# Patient Record
Sex: Female | Born: 1970 | Hispanic: Yes | Marital: Single | Smoking: Current every day smoker
Health system: Southern US, Community
[De-identification: ages and names within clinical notes are randomized; demographics above are authoritative.]

---

## 1994-06-17 HISTORY — PX: TUBAL LIGATION: SHX77

## 2012-11-21 DIAGNOSIS — F329 Major depressive disorder, single episode, unspecified: Secondary | ICD-10-CM | POA: Insufficient documentation

## 2016-06-17 HISTORY — PX: OTHER SURGICAL HISTORY: SHX169

## 2016-12-03 ENCOUNTER — Telehealth: Payer: Self-pay | Admitting: *Deleted

## 2016-12-03 ENCOUNTER — Encounter: Payer: Self-pay | Admitting: Podiatry

## 2016-12-03 ENCOUNTER — Ambulatory Visit (INDEPENDENT_AMBULATORY_CARE_PROVIDER_SITE_OTHER): Payer: BLUE CROSS/BLUE SHIELD | Admitting: Podiatry

## 2016-12-03 VITALS — BP 112/65 | HR 64

## 2016-12-03 DIAGNOSIS — L97522 Non-pressure chronic ulcer of other part of left foot with fat layer exposed: Secondary | ICD-10-CM | POA: Diagnosis not present

## 2016-12-03 DIAGNOSIS — M21962 Unspecified acquired deformity of left lower leg: Secondary | ICD-10-CM | POA: Diagnosis not present

## 2016-12-03 DIAGNOSIS — M79672 Pain in left foot: Secondary | ICD-10-CM | POA: Diagnosis not present

## 2016-12-03 NOTE — Patient Instructions (Signed)
Seen for infected ulcer left foot. Wound soaked and debrided. Amerigel ointment dressing applied. Daily soaking and dressing change instructed.

## 2016-12-03 NOTE — Progress Notes (Signed)
SUBJECTIVE: 46 y.o. year old female presents complaining of pain in left foot. Stated that she was in hospital with infected left foot on 11/30/16.  She started to have blister with pain under the left foot a month ago. She treated with ointment. It got better and got worse. She noted of a hole formed on the area, ball of lefollen and painful on June 13th and 14th, so she checked in [facility] ER on 11/30/16. They placed her on Antibiotics and gave her pain pills. 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 The foot started with blister the got infected like this time. On feet 10 hours for 4 days at work.  On Clindamycin 300 mg q 6 hr and Hydrocodone 5/325 as needed.  REVIEW OF SYSTEMS: Pertinent items noted in HPI and remainder of comprehensive ROS otherwise negative.  OBJECTIVE: DERMATOLOGIC EXAMINATION: Plantar lesion with 2 cm wide diameter opening and callused border under 4th MPJ area left foot. Opening is deep down to subfascial area. No active drainage noted.  VASCULAR EXAMINATION OF LOWER LIMBS: All pedal pulses are palpable with normal pulsation.  Capillary Filling times within 3 seconds in all digits.  No acute edema or erythema noted. Temperature gradient from tibial crest to dorsum of foot is within normal bilateral.  NEUROLOGIC EXAMINATION OF THE LOWER LIMBS: Achilles DTR is present and within normal. Monofilament (Semmes-Weinstein 10-gm) sensory testing positive 6 out of 6, bilateral. Vibratory sensations(128Hz  turning fork) intact at medial and lateral forefoot bilateral.  Sharp and Dull discriminatory sensations at the plantar ball of hallux is intact bilateral.   MUSCULOSKELETAL EXAMINATION: Positive for High arched cavus foot with loss of 5th ray left.  RADIOGRAPHIC FINDINGS: AP View:  Post surgical 5th ray amputation at distal 1/3 of metatarsal shaft left foot. 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 Adducted  metatarsal bone with contracted lesser digits.  Osteopenia and increased bone resorption at lesser metatarsal head and proximal phalangeal base. Lateral view:  Severe high arched cavus foot. Subluxed MPJ with flexion deformity on IPJ phalanges.  Normal rearfoot joint spaces.   ASSESSMENT: Ulcer, infected under 4th MPJ left. 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  PLAN: Reviewed clinical findings and available treatment options. Left foot soaked for 15 minutes. Wound debrided. Amerigel ointment dressing applied. Daily foot soak instructed. Iodine soaking solution dispensed. Continue with antibiotics. Stay off of work till further notice. Return in one week.

## 2016-12-03 NOTE — Telephone Encounter (Signed)
12/03/16 Patient called this afternoon and states [facility] called them and insurance doesn't cover the meds prescribed.

## 2016-12-10 ENCOUNTER — Ambulatory Visit: Payer: BLUE CROSS/BLUE SHIELD | Admitting: Podiatry

## 2017-07-02 DIAGNOSIS — L03116 Cellulitis of left lower limb: Secondary | ICD-10-CM | POA: Insufficient documentation

## 2017-07-03 DIAGNOSIS — M86272 Subacute osteomyelitis, left ankle and foot: Secondary | ICD-10-CM | POA: Insufficient documentation

## 2017-07-03 DIAGNOSIS — Z792 Long term (current) use of antibiotics: Secondary | ICD-10-CM | POA: Insufficient documentation

## 2017-07-03 DIAGNOSIS — Z720 Tobacco use: Secondary | ICD-10-CM | POA: Insufficient documentation

## 2019-03-23 ENCOUNTER — Encounter: Payer: Self-pay | Admitting: Sports Medicine

## 2019-03-23 ENCOUNTER — Other Ambulatory Visit: Payer: Self-pay | Admitting: Sports Medicine

## 2019-03-23 ENCOUNTER — Ambulatory Visit (INDEPENDENT_AMBULATORY_CARE_PROVIDER_SITE_OTHER): Payer: 59 | Admitting: Sports Medicine

## 2019-03-23 ENCOUNTER — Other Ambulatory Visit: Payer: Self-pay

## 2019-03-23 ENCOUNTER — Encounter: Payer: Self-pay | Admitting: *Deleted

## 2019-03-23 ENCOUNTER — Ambulatory Visit: Payer: 59

## 2019-03-23 DIAGNOSIS — L03116 Cellulitis of left lower limb: Secondary | ICD-10-CM | POA: Diagnosis not present

## 2019-03-23 DIAGNOSIS — M86272 Subacute osteomyelitis, left ankle and foot: Secondary | ICD-10-CM

## 2019-03-23 DIAGNOSIS — L97522 Non-pressure chronic ulcer of other part of left foot with fat layer exposed: Secondary | ICD-10-CM | POA: Diagnosis not present

## 2019-03-23 DIAGNOSIS — M79671 Pain in right foot: Secondary | ICD-10-CM

## 2019-03-23 DIAGNOSIS — S52531A Colles' fracture of right radius, initial encounter for closed fracture: Secondary | ICD-10-CM | POA: Insufficient documentation

## 2019-03-23 MED ORDER — MUPIROCIN 2 % EX OINT: TOPICAL_OINTMENT | CUTANEOUS | 0 refills | Status: DC

## 2019-03-23 MED ORDER — SULFAMETHOXAZOLE-TRIMETHOPRIM 800-160 MG PO TABS: 1.0000 | ORAL_TABLET | Freq: Two times a day (BID) | ORAL | 0 refills | Status: DC

## 2019-03-23 NOTE — Progress Notes (Signed)
Subjective: Maliha Outten is a 48 y.o. female patient seen in office for evaluation of ulceration of the Left plantar forefoot. Patient has a history of foot ulcer and infection last year, reports that swelling and redness got worse on last Thursday but over the last month this problem has come back with callus to the area. Denies nausea/fever/vomiting/chills/night sweats/shortness of breath/pain. Patient has no other pedal complaints at this time.  Denies history of Diabetes but reports that she can not feel her foot. Admits to a history of infection last year and was put on IV antibiotics.   Patient Active Problem List   Diagnosis Date Noted  . Fracture, Col[place], right, closed 03/23/2019  . Subacute osteomyelitis of left foot ([place]) 07/03/2017  . Long term (current) use of antibiotics 07/03/2017  . Tobacco use 07/03/2017  . Cellulitis of left foot 07/02/2017  . Depressive disorder 11/21/2012   No current outpatient medications on file prior to visit.   No current facility-administered medications on file prior to visit.    No Known Allergies  No results found for this or any previous visit (from the past 2160 hour(s)).  Objective: There were no vitals filed for this visit.  General: Patient is awake, alert, oriented x 3 and in no acute distress.  Dermatology: Skin is warm and dry bilateral with a full thickness ulceration present  Sub met 4 on left. Ulceration measures 0.5 cm x 0.5cm x 0.5 cm. There is a  keratotic border with a macerated base. The ulceration does probe to capsule. There is no malodor, no active drainage, + erythema, + edema. No other acute signs of infection.   Vascular: Dorsalis Pedis pulse = 1/4 Bilateral,  Posterior Tibial pulse = 1/4 Bilateral,  Capillary Fill Time < 5 seconds  Neurologic: Protective sensation severely diminished on left foot tested with the 5.07/10g Semmes Weinstein Monofilament.  Musculosketal: No Pain with palpation to ulcerated area. No  pain with compression to calves bilateral. + Hammertoe gross bony deformities noted bilateral.  Xrays, Left, s/p 5th toe amputation, 4th met head with bony destruction suggestive of osteomyelitis. No gas in soft tissues.   No results for input(s): GRAMSTAIN, LABORGA in the last 8760 hours.  Assessment and Plan:  Problem List Items Addressed This Visit      Musculoskeletal and Integument   Cellulitis of left foot   Subacute osteomyelitis of left foot (HCC)   Relevant Medications   sulfamethoxazole-trimethoprim (BACTRIM DS) 800-160 MG tablet   mupirocin ointment (BACTROBAN) 2 %    Other Visit Diagnoses    Foot ulcer, left, with fat layer exposed ([place])    -  Primary   Relevant Orders   WOUND CULTURE     -Examined patient and discussed the progression of the wound and treatment alternatives. -Xrays reviewed - Excisionally dedbrided ulceration at sub met 4 on left to healthy bleeding borders removing nonviable tissue using a sterile chisel blade. Wound measures post debridement as debrided. Wound was debrided to the level of the dermis with viable wound base exposed to promote healing. Hemostasis was achieved with manuel pressure. Patient tolerated procedure well without any discomfort or anesthesia necessary for this wound debridement.  -Wound culture obtained -Rx Bactrim -Applied betadine and dry sterile dressing and instructed patient to continue with daily dressings at home consisting of batroban and bandaid/dry sterile dressing. - Advised patient to go to the ER or return to office if the wound worsens or if constitutional symptoms are present. -Work note: Resume work on  Sunday if cellulitis is better -Patient to return to office in 1 week for follow up care and evaluation or sooner if problems arise. Ordered repeat MRI r/o Osteomyelitis on left.  Landis Martins, DPM

## 2019-03-24 ENCOUNTER — Telehealth: Payer: Self-pay | Admitting: *Deleted

## 2019-03-24 DIAGNOSIS — M86272 Subacute osteomyelitis, left ankle and foot: Secondary | ICD-10-CM

## 2019-03-24 DIAGNOSIS — L97522 Non-pressure chronic ulcer of other part of left foot with fat layer exposed: Secondary | ICD-10-CM

## 2019-03-24 DIAGNOSIS — L03116 Cellulitis of left lower limb: Secondary | ICD-10-CM

## 2019-03-24 NOTE — Telephone Encounter (Signed)
-----  Message from [place], [place] sent at 03/23/2019 10:24 PM EDT ----- Regarding: MRI L foot Evaluate for osteomyelitis recurrent ulcer sub met 4 with cellulitis

## 2019-03-24 NOTE — Telephone Encounter (Signed)
Orders to L. Cox, CMA for pre-cert and faxed to [facility]. 

## 2019-03-25 ENCOUNTER — Telehealth: Payer: Self-pay | Admitting: *Deleted

## 2019-03-25 NOTE — Telephone Encounter (Signed)
Called and spoke with Verdis Frederickson with [facility] and the representative stated that we would have to fax over the medical notes to the prior authorization department at [phone] and I have faxed over today. Lattie Haw

## 2019-03-27 LAB — WOUND CULTURE
MICRO NUMBER:: 959433
SPECIMEN QUALITY:: ADEQUATE

## 2019-03-30 ENCOUNTER — Other Ambulatory Visit: Payer: Self-pay

## 2019-03-30 ENCOUNTER — Encounter: Payer: Self-pay | Admitting: Sports Medicine

## 2019-03-30 ENCOUNTER — Ambulatory Visit (INDEPENDENT_AMBULATORY_CARE_PROVIDER_SITE_OTHER): Payer: 59 | Admitting: Sports Medicine

## 2019-03-30 DIAGNOSIS — L03116 Cellulitis of left lower limb: Secondary | ICD-10-CM | POA: Diagnosis not present

## 2019-03-30 DIAGNOSIS — L97522 Non-pressure chronic ulcer of other part of left foot with fat layer exposed: Secondary | ICD-10-CM | POA: Diagnosis not present

## 2019-03-30 DIAGNOSIS — M86272 Subacute osteomyelitis, left ankle and foot: Secondary | ICD-10-CM

## 2019-03-30 NOTE — Progress Notes (Signed)
Subjective: Jordan Burke is a 48 y.o. female patient seen in office for follow-up evaluation of left foot ulcer.  Patient reports that the swelling and redness is much better and that there is very minimal pain.  Patient reports that antibiotics are helping pretty significantly after about the second or third day of taking it.  Patient denies any increase in redness warmth drainage or any other acute symptoms at this time.  Patient Active Problem List   Diagnosis Date Noted  . Fracture, Colles, right, closed 03/23/2019  . Subacute osteomyelitis of left foot (HCC) 07/03/2017  . Long term (current) use of antibiotics 07/03/2017  . Tobacco use 07/03/2017  . Cellulitis of left foot 07/02/2017  . Depressive disorder 11/21/2012   Current Outpatient Medications on File Prior to Visit  Medication Sig Dispense Refill  . mupirocin ointment (BACTROBAN) 2 % To left foot wound daily 22 g 0  . sulfamethoxazole-trimethoprim (BACTRIM DS) 800-160 MG tablet Take 1 tablet by mouth 2 (two) times daily. 28 tablet 0   No current facility-administered medications on file prior to visit.    No Known Allergies  Recent Results (from the past 2160 hour(s))  WOUND CULTURE     Status: None   Collection Time: 03/23/19  3:00 PM   Specimen: Abscess; Wound  Result Value Ref Range   MICRO NUMBER: [id number]    SPECIMEN QUALITY: Adequate    SOURCE: LEFT FOOT    STATUS: FINAL    GRAM STAIN:      No white blood cells seen Few Gram positive cocci in pairs   ISOLATE 1: Staphylococcus lugdunensis     Comment: Heavy growth of Staphylococcus lugdunensis      Susceptibility   Staphylococcus lugdunensis - AEROBIC CULT, GRAM STAIN POSITIVE 1    VANCOMYCIN <=0.5 Sensitive     CIPROFLOXACIN <=0.5 Sensitive     CLINDAMYCIN <=0.25 Sensitive     LEVOFLOXACIN 0.25 Sensitive     ERYTHROMYCIN <=0.25 Sensitive     GENTAMICIN <=0.5 Sensitive     OXACILLIN* 2 Sensitive      * Oxacillin-susceptible staphylococci  aresusceptible to other penicillinase-stablepenicillins (e.g. Methicillin, Nafcillin), beta-lactam/beta-lactamase inhibitor combinations, andcephems with staphylococcal indications, includingCefazolin.    TETRACYCLINE <=1 Sensitive     TRIMETH/SULFA* <=10 Sensitive      * Oxacillin-susceptible staphylococci aresusceptible to other penicillinase-stablepenicillins (e.g. Methicillin, Nafcillin), beta-lactam/beta-lactamase inhibitor combinations, andcephems with staphylococcal indications, includingCefazolin.Legend:S = Susceptible  I = IntermediateR = Resistant  NS = Not susceptible* = Not tested  NR = Not reported**NN = See antimicrobic comments    Objective: There were no vitals filed for this visit.  General: Patient is awake, alert, oriented x 3 and in no acute distress.  Dermatology: Skin is warm and dry bilateral with a healed ulceration plantar fourth metatarsal head with mild reactive keratosis, decreased edema, no erythema, + rash at ankle. No other acute signs of infection.   Vascular: Dorsalis Pedis pulse = 1/4 Bilateral,  Posterior Tibial pulse = 1/4 Bilateral,  Capillary Fill Time < 5 seconds  Neurologic: Protective sensation severely diminished on left foot tested with the 5.07/10g Semmes Weinstein Monofilament.  Musculosketal: No Pain with palpation to prematurely healed ulcerated area. No pain with compression to calves bilateral. + Hammertoe gross bony deformities noted bilateral.  No results for input(s): GRAMSTAIN, LABORGA in the last 8760 hours.  Assessment and Plan:  Problem List Items Addressed This Visit      Musculoskeletal and Integument   Cellulitis of left foot   Subacute  osteomyelitis of left foot ([place])    Other Visit Diagnoses    Foot ulcer, left, with fat layer exposed ([place])    -  Primary   Prematurely healed     -Examined patient and discussed the progression of the wound and treatment alternatives. -Plantar ulcer on left foot appears to be callused over  and prematurely healed -Continue with Bactrim -Applied offloading pad on left and advised patient to do the same to prevent re-ulceration -Advised patient to continue with closely monitoring her left foot and ankle if swelling or pain worsens to return to office otherwise to await MRI for further evaluation suspected osteomyelitis fourth metatarsal -Patient to return to office after MRI.  Landis Martins, DPM

## 2019-04-14 ENCOUNTER — Ambulatory Visit
Admission: RE | Admit: 2019-04-14 | Discharge: 2019-04-14 | Disposition: A | Discharge status: Home / Self Care | Payer: 59 | Source: Ambulatory Visit | Attending: Sports Medicine | Admitting: Sports Medicine

## 2019-04-14 DIAGNOSIS — M86272 Subacute osteomyelitis, left ankle and foot: Secondary | ICD-10-CM

## 2019-04-14 DIAGNOSIS — L97522 Non-pressure chronic ulcer of other part of left foot with fat layer exposed: Secondary | ICD-10-CM

## 2019-04-14 DIAGNOSIS — L03116 Cellulitis of left lower limb: Secondary | ICD-10-CM

## 2019-04-14 MED ORDER — GADOBENATE DIMEGLUMINE 529 MG/ML IV SOLN
14.0000 mL | Freq: Once | INTRAVENOUS | Status: AC | PRN
  Administered 2019-04-14: 18:00:00 14 mL via INTRAVENOUS

## 2019-04-15 ENCOUNTER — Telehealth: Payer: Self-pay

## 2019-04-15 NOTE — Telephone Encounter (Signed)
LVM to Pt stating to give [place] a call back to the office to go over her MRI results and the Dr's recommendation

## 2019-04-15 NOTE — Telephone Encounter (Signed)
-----   Message from Landis Martins, [place] sent at 04/15/2019 12:00 PM EDT ----- Will you let patient know that her MRI is + for bone infection at the 4th toe joint. She should make a follow up appointment in [place] when she can for [place] to discuss surgery options -Dr. Cannon Kettle

## 2019-04-21 ENCOUNTER — Other Ambulatory Visit: Payer: Self-pay | Admitting: Sports Medicine

## 2019-04-22 NOTE — Telephone Encounter (Signed)
Dr. Stover please advice 

## 2020-02-03 ENCOUNTER — Other Ambulatory Visit: Payer: Self-pay | Admitting: Family Medicine

## 2020-02-03 ENCOUNTER — Other Ambulatory Visit: Payer: Self-pay

## 2020-02-03 ENCOUNTER — Ambulatory Visit: Payer: 59

## 2020-02-03 DIAGNOSIS — M25532 Pain in left wrist: Secondary | ICD-10-CM

## 2020-02-03 DIAGNOSIS — M25512 Pain in left shoulder: Secondary | ICD-10-CM

## 2020-05-05 ENCOUNTER — Emergency Department (HOSPITAL_BASED_OUTPATIENT_CLINIC_OR_DEPARTMENT_OTHER): Payer: 59 | Admitting: Certified Registered"

## 2020-05-05 ENCOUNTER — Encounter (HOSPITAL_COMMUNITY): Payer: Self-pay

## 2020-05-05 ENCOUNTER — Ambulatory Visit (HOSPITAL_BASED_OUTPATIENT_CLINIC_OR_DEPARTMENT_OTHER): Admit: 2020-05-05 | Payer: 59 | Admitting: Orthopedic Surgery

## 2020-05-05 ENCOUNTER — Ambulatory Visit (HOSPITAL_COMMUNITY)
Admission: RE | Admit: 2020-05-05 | Discharge: 2020-05-05 | Disposition: A | Discharge status: Home / Self Care | Payer: 59 | Attending: Orthopedic Surgery | Admitting: Orthopedic Surgery

## 2020-05-05 ENCOUNTER — Other Ambulatory Visit: Payer: Self-pay

## 2020-05-05 ENCOUNTER — Emergency Department (HOSPITAL_COMMUNITY): Payer: 59

## 2020-05-05 ENCOUNTER — Encounter (HOSPITAL_BASED_OUTPATIENT_CLINIC_OR_DEPARTMENT_OTHER)
Admission: RE | Disposition: A | Discharge status: Home / Self Care | Payer: Self-pay | Source: Home / Self Care | Attending: Emergency Medicine

## 2020-05-05 DIAGNOSIS — Z20822 Contact with and (suspected) exposure to covid-19: Secondary | ICD-10-CM | POA: Diagnosis not present

## 2020-05-05 DIAGNOSIS — S52591A Other fractures of lower end of right radius, initial encounter for closed fracture: Secondary | ICD-10-CM | POA: Insufficient documentation

## 2020-05-05 DIAGNOSIS — S52201A Unspecified fracture of shaft of right ulna, initial encounter for closed fracture: Secondary | ICD-10-CM | POA: Insufficient documentation

## 2020-05-05 DIAGNOSIS — S0083XA Contusion of other part of head, initial encounter: Secondary | ICD-10-CM | POA: Diagnosis not present

## 2020-05-05 DIAGNOSIS — R55 Syncope and collapse: Secondary | ICD-10-CM | POA: Diagnosis not present

## 2020-05-05 DIAGNOSIS — M79631 Pain in right forearm: Secondary | ICD-10-CM | POA: Diagnosis not present

## 2020-05-05 HISTORY — PX: ORIF ULNAR FRACTURE: SHX5417

## 2020-05-05 HISTORY — PX: OPEN REDUCTION INTERNAL FIXATION (ORIF) DISTAL RADIAL FRACTURE: SHX5989

## 2020-05-05 LAB — RESP PANEL BY RT-PCR (FLU A&B, COVID) ARPGX2
Influenza A by PCR: NEGATIVE
Influenza B by PCR: NEGATIVE
SARS Coronavirus 2 by RT PCR: NEGATIVE

## 2020-05-05 SURGERY — OPEN REDUCTION INTERNAL FIXATION (ORIF) DISTAL RADIUS FRACTURE
Anesthesia: General | Site: Arm Lower | Laterality: Right

## 2020-05-05 MED ORDER — FENTANYL CITRATE (PF) 100 MCG/2ML IJ SOLN
50.0000 ug | INTRAMUSCULAR | Status: AC | PRN
  Administered 2020-05-05 (×2): 50 ug via INTRAVENOUS
  Filled 2020-05-05 (×2): qty 2

## 2020-05-05 MED ORDER — PROPOFOL 10 MG/ML IV BOLUS
INTRAVENOUS | Status: DC | PRN
  Administered 2020-05-05: 150 mg via INTRAVENOUS

## 2020-05-05 MED ORDER — ONDANSETRON HCL 4 MG/2ML IJ SOLN
INTRAMUSCULAR | Status: DC | PRN
  Administered 2020-05-05: 4 mg via INTRAVENOUS

## 2020-05-05 MED ORDER — MIDAZOLAM HCL 2 MG/2ML IJ SOLN
2.0000 mg | Freq: Once | INTRAMUSCULAR | Status: AC
  Administered 2020-05-05: 1 mg via INTRAVENOUS

## 2020-05-05 MED ORDER — SODIUM CHLORIDE 0.9 % IV BOLUS
1000.0000 mL | Freq: Once | INTRAVENOUS | Status: AC
  Administered 2020-05-05: 1000 mL via INTRAVENOUS

## 2020-05-05 MED ORDER — FENTANYL CITRATE (PF) 100 MCG/2ML IJ SOLN
50.0000 ug | Freq: Once | INTRAMUSCULAR | Status: AC
  Administered 2020-05-05: 50 ug via INTRAVENOUS
  Filled 2020-05-05: qty 2

## 2020-05-05 MED ORDER — SULFAMETHOXAZOLE-TRIMETHOPRIM 800-160 MG PO TABS: 1.0000 | ORAL_TABLET | Freq: Two times a day (BID) | ORAL | 0 refills | Status: AC

## 2020-05-05 MED ORDER — CHLORHEXIDINE GLUCONATE 4 % EX LIQD: 60.0000 mL | Freq: Once | CUTANEOUS | Status: DC

## 2020-05-05 MED ORDER — HYDROCODONE-ACETAMINOPHEN 5-325 MG PO TABS: ORAL_TABLET | ORAL | 0 refills | Status: AC

## 2020-05-05 MED ORDER — HYDROMORPHONE HCL 1 MG/ML IJ SOLN
0.5000 mg | Freq: Once | INTRAMUSCULAR | Status: AC
  Administered 2020-05-05: 0.5 mg via INTRAVENOUS
  Filled 2020-05-05: qty 1

## 2020-05-05 MED ORDER — HYDROMORPHONE HCL 1 MG/ML IJ SOLN
1.0000 mg | Freq: Once | INTRAMUSCULAR | Status: AC
  Administered 2020-05-05: 1 mg via INTRAVENOUS
  Filled 2020-05-05: qty 1

## 2020-05-05 MED ORDER — MIDAZOLAM HCL 2 MG/2ML IJ SOLN
INTRAMUSCULAR | Status: AC
  Filled 2020-05-05: qty 2

## 2020-05-05 MED ORDER — OXYCODONE-ACETAMINOPHEN 5-325 MG PO TABS
1.0000 | ORAL_TABLET | ORAL | Status: DC | PRN
  Administered 2020-05-05: 1 via ORAL
  Filled 2020-05-05: qty 1

## 2020-05-05 MED ORDER — CEFAZOLIN SODIUM-DEXTROSE 2-4 GM/100ML-% IV SOLN
2.0000 g | INTRAVENOUS | Status: AC
  Administered 2020-05-05: 2 g via INTRAVENOUS

## 2020-05-05 MED ORDER — CEFAZOLIN SODIUM-DEXTROSE 2-4 GM/100ML-% IV SOLN
INTRAVENOUS | Status: AC
  Filled 2020-05-05: qty 100

## 2020-05-05 MED ORDER — BUPIVACAINE-EPINEPHRINE (PF) 0.5% -1:200000 IJ SOLN
INTRAMUSCULAR | Status: DC | PRN
  Administered 2020-05-05: 25 mL via PERINEURAL

## 2020-05-05 MED ORDER — DEXAMETHASONE SODIUM PHOSPHATE 10 MG/ML IJ SOLN
INTRAMUSCULAR | Status: DC | PRN
  Administered 2020-05-05: 4 mg via INTRAVENOUS

## 2020-05-05 MED ORDER — LACTATED RINGERS IV SOLN: INTRAVENOUS | Status: DC

## 2020-05-05 MED ORDER — ACETAMINOPHEN 500 MG PO TABS
ORAL_TABLET | ORAL | Status: AC
  Filled 2020-05-05: qty 2

## 2020-05-05 MED ORDER — LIDOCAINE HCL (CARDIAC) PF 100 MG/5ML IV SOSY
PREFILLED_SYRINGE | INTRAVENOUS | Status: DC | PRN
  Administered 2020-05-05: 30 mg via INTRAVENOUS

## 2020-05-05 MED ORDER — PROPOFOL 500 MG/50ML IV EMUL
INTRAVENOUS | Status: DC | PRN
  Administered 2020-05-05: 25 ug/kg/min via INTRAVENOUS

## 2020-05-05 MED ORDER — POVIDONE-IODINE 10 % EX SWAB
2.0000 "application " | Freq: Once | CUTANEOUS | Status: AC
  Administered 2020-05-05: 2 via TOPICAL

## 2020-05-05 MED ORDER — FENTANYL CITRATE (PF) 100 MCG/2ML IJ SOLN
100.0000 ug | Freq: Once | INTRAMUSCULAR | Status: AC
  Administered 2020-05-05: 50 ug via INTRAVENOUS

## 2020-05-05 MED ORDER — FENTANYL CITRATE (PF) 100 MCG/2ML IJ SOLN
INTRAMUSCULAR | Status: AC
  Filled 2020-05-05: qty 2

## 2020-05-05 SURGICAL SUPPLY — 91 items
APL PRP STRL LF DISP 70% ISPRP (MISCELLANEOUS) ×1
BIT DRILL 2.0 LNG QUCK RELEASE (BIT) ×1 IMPLANT
BIT DRILL QC 2.8X5 (BIT) ×3 IMPLANT
BLADE MINI RND TIP GREEN BEAV (BLADE) ×3 IMPLANT
BLADE SURG 15 STRL LF DISP TIS (BLADE) ×2 IMPLANT
BLADE SURG 15 STRL SS (BLADE) ×6
BNDG CMPR 9X4 STRL LF SNTH (GAUZE/BANDAGES/DRESSINGS) ×1
BNDG CONFORM 3 STRL LF (GAUZE/BANDAGES/DRESSINGS) IMPLANT
BNDG ELASTIC 2X5.8 VLCR STR LF (GAUZE/BANDAGES/DRESSINGS) ×3 IMPLANT
BNDG ELASTIC 3X5.8 VLCR STR LF (GAUZE/BANDAGES/DRESSINGS) ×6 IMPLANT
BNDG ESMARK 4X9 LF (GAUZE/BANDAGES/DRESSINGS) ×3 IMPLANT
BNDG GAUZE ELAST 4 BULKY (GAUZE/BANDAGES/DRESSINGS) ×3 IMPLANT
BNDG PLASTER X FAST 3X3 WHT LF (CAST SUPPLIES) ×90 IMPLANT
BNDG PLSTR 9X3 FST ST WHT (CAST SUPPLIES) ×30
CHLORAPREP W/TINT 26 (MISCELLANEOUS) ×3 IMPLANT
CORD BIPOLAR FORCEPS 12FT (ELECTRODE) ×3 IMPLANT
COVER BACK TABLE 60X90IN (DRAPES) ×3 IMPLANT
COVER MAYO STAND STRL (DRAPES) ×3 IMPLANT
COVER WAND RF STERILE (DRAPES) IMPLANT
CUFF TOURN SGL QUICK 18X4 (TOURNIQUET CUFF) ×3 IMPLANT
CUFF TOURN SGL QUICK 24 (TOURNIQUET CUFF)
CUFF TRNQT CYL 24X4X16.5-23 (TOURNIQUET CUFF) IMPLANT
DRAPE EXTREMITY T 121X128X90 (DISPOSABLE) ×3 IMPLANT
DRAPE OEC MINIVIEW 54X84 (DRAPES) ×3 IMPLANT
DRAPE SURG 17X23 STRL (DRAPES) ×3 IMPLANT
DRILL 2.0 LNG QUICK RELEASE (BIT) ×3
DRSG PAD ABDOMINAL 8X10 ST (GAUZE/BANDAGES/DRESSINGS) ×3 IMPLANT
GAUZE SPONGE 4X4 12PLY STRL (GAUZE/BANDAGES/DRESSINGS) ×3 IMPLANT
GAUZE XEROFORM 1X8 LF (GAUZE/BANDAGES/DRESSINGS) ×6 IMPLANT
GLOVE BIO SURGEON STRL SZ7.5 (GLOVE) ×3 IMPLANT
GLOVE BIOGEL PI IND STRL 6.5 (GLOVE) ×1 IMPLANT
GLOVE BIOGEL PI IND STRL 7.0 (GLOVE) ×1 IMPLANT
GLOVE BIOGEL PI IND STRL 8 (GLOVE) ×1 IMPLANT
GLOVE BIOGEL PI IND STRL 8.5 (GLOVE) ×1 IMPLANT
GLOVE BIOGEL PI INDICATOR 6.5 (GLOVE) ×2
GLOVE BIOGEL PI INDICATOR 7.0 (GLOVE) ×2
GLOVE BIOGEL PI INDICATOR 8 (GLOVE) ×2
GLOVE BIOGEL PI INDICATOR 8.5 (GLOVE) ×2
GLOVE SURG ORTHO 8.0 STRL STRW (GLOVE) ×3 IMPLANT
GOWN STRL REUS W/ TWL LRG LVL3 (GOWN DISPOSABLE) ×2 IMPLANT
GOWN STRL REUS W/TWL LRG LVL3 (GOWN DISPOSABLE) ×6
GOWN STRL REUS W/TWL XL LVL3 (GOWN DISPOSABLE) ×3 IMPLANT
GOWN STRL REUS W/TWL XL LVL4 (GOWN DISPOSABLE) ×6 IMPLANT
GUIDEWIRE ORTHO 0.054X6 (WIRE) ×9 IMPLANT
NEEDLE HYPO 22GX1.5 SAFETY (NEEDLE) IMPLANT
NEEDLE HYPO 25X1 1.5 SAFETY (NEEDLE) IMPLANT
NS IRRIG 1000ML POUR BTL (IV SOLUTION) ×3 IMPLANT
PACK BASIN DAY SURGERY FS (CUSTOM PROCEDURE TRAY) ×3 IMPLANT
PAD CAST 3X4 CTTN HI CHSV (CAST SUPPLIES) ×2 IMPLANT
PAD CAST 4YDX4 CTTN HI CHSV (CAST SUPPLIES) IMPLANT
PADDING CAST ABS 4INX4YD NS (CAST SUPPLIES) ×2
PADDING CAST ABS COTTON 4X4 ST (CAST SUPPLIES) ×1 IMPLANT
PADDING CAST COTTON 3X4 STRL (CAST SUPPLIES) ×6
PADDING CAST COTTON 4X4 STRL (CAST SUPPLIES)
PLATE ACULOC 2 EXTENTION (Plate) ×3 IMPLANT
PLATE R LONG NARROW PROX (Plate) ×3 IMPLANT
PLATE ULNA MIDSHAFT 6 HOLE (Plate) ×3 IMPLANT
SCREW ACTK 2 NL HEX 3.5.11 (Screw) ×3 IMPLANT
SCREW BONE HEX N/L 3.5X9 (Screw) ×1 IMPLANT
SCREW BONE HEX N/L 3.5X9MM (Screw) ×3 IMPLANT
SCREW CANN THRD 5.5MMX55MM (Screw) ×3 IMPLANT
SCREW CORT FT 22X2.3XLCK HEX (Screw) ×2 IMPLANT
SCREW CORTICAL LOCKING 2.3X18M (Screw) ×3 IMPLANT
SCREW CORTICAL LOCKING 2.3X20M (Screw) ×6 IMPLANT
SCREW CORTICAL LOCKING 2.3X22M (Screw) ×6 IMPLANT
SCREW FX18X2.3XSMTH LCK NS CRT (Screw) ×1 IMPLANT
SCREW FX20X2.3XSMTH LCK NS CRT (Screw) ×2 IMPLANT
SCREW HEXALOBE NON-LOCK 3.5X14 (Screw) ×12 IMPLANT
SCREW NLCKG 13 3.5X13 HEXA (Screw) ×3 IMPLANT
SCREW NON LOCK 3.5X10MM (Screw) ×12 IMPLANT
SCREW NON-LOCK 3.5X13 (Screw) ×9 IMPLANT
SCREW NONLOCK HEX 3.5X12 (Screw) ×9 IMPLANT
SLEEVE SCD COMPRESS KNEE MED (MISCELLANEOUS) IMPLANT
SLING ARM FOAM STRAP LRG (SOFTGOODS) ×3 IMPLANT
SPLINT PLASTER CAST XFAST 4X15 (CAST SUPPLIES) IMPLANT
SPLINT PLASTER XTRA FAST SET 4 (CAST SUPPLIES)
STAPLER VISISTAT (STAPLE) ×3 IMPLANT
STOCKINETTE 4X48 STRL (DRAPES) ×3 IMPLANT
SUCTION FRAZIER HANDLE 10FR (MISCELLANEOUS) ×2
SUCTION TUBE FRAZIER 10FR DISP (MISCELLANEOUS) ×1 IMPLANT
SUT ETHILON 3 0 PS 1 (SUTURE) IMPLANT
SUT ETHILON 4 0 PS 2 18 (SUTURE) ×3 IMPLANT
SUT VIC AB 3-0 PS1 18 (SUTURE)
SUT VIC AB 3-0 PS1 18XBRD (SUTURE) IMPLANT
SUT VICRYL 4-0 PS2 18IN ABS (SUTURE) ×3 IMPLANT
SYR BULB EAR ULCER 3OZ GRN STR (SYRINGE) ×3 IMPLANT
SYR CONTROL 10ML LL (SYRINGE) IMPLANT
TOWEL GREEN STERILE FF (TOWEL DISPOSABLE) ×6 IMPLANT
TUBE CONNECTING 20'X1/4 (TUBING) ×1
TUBE CONNECTING 20X1/4 (TUBING) ×2 IMPLANT
UNDERPAD 30X36 HEAVY ABSORB (UNDERPADS AND DIAPERS) ×3 IMPLANT

## 2020-05-05 NOTE — Op Note (Signed)
I assisted Surgeon(s) and Role:    Betha Loa, MD - Primary on the Procedure(s): OPEN REDUCTION INTERNAL FIXATION (ORIF) DISTAL RADIAL FRACTURE OPEN REDUCTION INTERNAL FIXATION (ORIF) ULNAR FRACTURE on 05/05/2020.  I provided assistance on this case as follows: Completion of PET plate removal and application of plate radius for radius fracture with reduction stabilization and fixation followed by approach isolation of the ulnar fracture reduction and stabilization and fixation with a plate on the ulna shaft right forearm with closure of the wound and application of splint.  Electronically signed by: Cindee Salt, MD Date: 05/05/2020 Time: 4:54 PM
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
## 2020-05-05 NOTE — Transfer of Care (Signed)
Immediate Anesthesia Transfer of Care Note  Patient: Jordan Burke  Procedure(s) Performed: OPEN REDUCTION INTERNAL FIXATION (ORIF) DISTAL RADIAL FRACTURE (Right ) OPEN REDUCTION INTERNAL FIXATION (ORIF) ULNAR FRACTURE (Right )  Patient Location: PACU  Anesthesia Type:GA combined with regional for post-op pain  Level of Consciousness: awake, alert , oriented and patient cooperative  Airway & Oxygen Therapy: Patient Spontanous Breathing and Patient connected to face mask oxygen  Post-op Assessment: Report given to RN and Post -op Vital signs reviewed and stable  Post vital signs: Reviewed and stable  Last Vitals:  Vitals Value Taken Time  BP    Temp    Pulse 88 05/05/20 1656  Resp 16 05/05/20 1656  SpO2 95 % 05/05/20 1656  Vitals shown include unvalidated device data.  Last Pain:  Vitals:   05/05/20 1247  TempSrc:   PainSc: 8       Patients Stated Pain Goal: 0 (05/05/20 0045)  Complications: No complications documented.

## 2020-05-05 NOTE — Anesthesia Preprocedure Evaluation (Signed)
Anesthesia Evaluation  Patient identified by MRN, date of birth, ID band Patient awake    Reviewed: Allergy & Precautions, H&P , NPO status , Patient's Chart, lab work & pertinent test results  Airway Mallampati: II   Neck ROM: full    Dental   Pulmonary Current Smoker,    breath sounds clear to auscultation       Cardiovascular negative cardio ROS   Rhythm:regular Rate:Normal     Neuro/Psych PSYCHIATRIC DISORDERS Depression    GI/Hepatic   Endo/Other    Renal/GU      Musculoskeletal   Abdominal   Peds  Hematology   Anesthesia Other Findings   Reproductive/Obstetrics                             Anesthesia Physical Anesthesia Plan  ASA: II  Anesthesia Plan: General   Post-op Pain Management:  Regional for Post-op pain   Induction: Intravenous  PONV Risk Score and Plan: 2 and Ondansetron, Dexamethasone, Midazolam and Treatment may vary due to age or medical condition  Airway Management Planned: LMA  Additional Equipment:   Intra-op Plan:   Post-operative Plan: Extubation in OR  Informed Consent: I have reviewed the patients History and Physical, chart, labs and discussed the procedure including the risks, benefits and alternatives for the proposed anesthesia with the patient or authorized representative who has indicated his/her understanding and acceptance.       Plan Discussed with: CRNA, Anesthesiologist and Surgeon  Anesthesia Plan Comments:         Anesthesia Quick Evaluation

## 2020-05-05 NOTE — Anesthesia Procedure Notes (Signed)
Anesthesia Regional Block: Supraclavicular block   Pre-Anesthetic Checklist: ,, timeout performed, Correct Patient, Correct Site, Correct Laterality, Correct Procedure, Correct Position, site marked, Risks and benefits discussed,  Surgical consent,  Pre-op evaluation,  At surgeon's request and post-op pain management  Laterality: Right  Prep: chloraprep       Needles:  Injection technique: Single-shot  Needle Type: Echogenic Stimulator Needle     Needle Length: 5cm  Needle Gauge: 22     Additional Needles:   Procedures:, nerve stimulator,,,,,,,   Nerve Stimulator or Paresthesia:  Response: biceps flexion, 0.45 mA,   Additional Responses:   Narrative:  Start time: 05/05/2020 1:54 PM End time: 05/05/2020 2:00 PM Injection made incrementally with aspirations every 5 mL.  Performed by: Personally  Anesthesiologist: Achille Rich, MD  Additional Notes: Functioning IV was confirmed and monitors were applied.  A 49mm 22ga Arrow echogenic stimulator needle was used. Sterile prep and drape,hand hygiene and sterile gloves were used.  Negative aspiration and negative test dose prior to incremental administration of local anesthetic. The patient tolerated the procedure well.  Ultrasound guidance: relevent anatomy identified, needle position confirmed, local anesthetic spread visualized around nerve(s), vascular puncture avoided.  Image printed for medical record.

## 2020-05-05 NOTE — Progress Notes (Signed)
Assisted Dr. Hodierne with right, ultrasound guided, supraclavicular block. Side rails up, monitors on throughout procedure. See vital signs in flow sheet. Tolerated Procedure well.  
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
## 2020-05-05 NOTE — Progress Notes (Signed)
Orthopedic Tech Progress Note Patient Details:  Jordan Burke [birth year] [id number]  Patient ID: Jordan Burke, female   DOB: 03/20/1971, 49 y.o.   MRN: [id number]   [name] 05/05/2020, 8:06 AM Temporary splint applied to right arm. Patient would not allow sugartong or proper application of splint.
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
## 2020-05-05 NOTE — ED Notes (Signed)
Ortho tech applied splint to right arm.

## 2020-05-05 NOTE — Anesthesia Procedure Notes (Signed)
Procedure Name: LMA Insertion Date/Time: 05/05/2020 2:28 PM Performed by: Sheryn Bison, CRNA Pre-anesthesia Checklist: Patient identified, Emergency Drugs available, Suction available and Patient being monitored Patient Re-evaluated:Patient Re-evaluated prior to induction Oxygen Delivery Method: Circle System Utilized Preoxygenation: Pre-oxygenation with 100% oxygen Induction Type: IV induction Ventilation: Mask ventilation without difficulty LMA: LMA inserted LMA Size: 4.0 Number of attempts: 1 Airway Equipment and Method: bite block Placement Confirmation: positive ETCO2 Tube secured with: Tape Dental Injury: Teeth and Oropharynx as per pre-operative assessment

## 2020-05-05 NOTE — Discharge Instructions (Addendum)
Post Anesthesia Home Care Instructions  Activity: Get plenty of rest for the remainder of the day. A responsible individual must stay with you for 24 hours following the procedure.  For the next 24 hours, DO NOT: -Drive a car -Operate machinery -Drink alcoholic beverages -Take any medication unless instructed by your physician -Make any legal decisions or sign important papers.  Meals: Start with liquid foods such as gelatin or soup. Progress to regular foods as tolerated. Avoid greasy, spicy, heavy foods. If nausea and/or vomiting occur, drink only clear liquids until the nausea and/or vomiting subsides. Call your physician if vomiting continues.  Special Instructions/Symptoms: Your throat may feel dry or sore from the anesthesia or the breathing tube placed in your throat during surgery. If this causes discomfort, gargle with warm salt water. The discomfort should disappear within 24 hours.  If you had a scopolamine patch placed behind your ear for the management of post- operative nausea and/or vomiting:  1. The medication in the patch is effective for 72 hours, after which it should be removed.  Wrap patch in a tissue and discard in the trash. Wash hands thoroughly with soap and water. 2. You may remove the patch earlier than 72 hours if you experience unpleasant side effects which may include dry mouth, dizziness or visual disturbances. 3. Avoid touching the patch. Wash your hands with soap and water after contact with the patch.      Regional Anesthesia Blocks  1. Numbness or the inability to move the "blocked" extremity may last from 3-48 hours after placement. The length of time depends on the medication injected and your individual response to the medication. If the numbness is not going away after 48 hours, call your surgeon.  2. The extremity that is blocked will need to be protected until the numbness is gone and the  Strength has returned. Because you cannot feel it, you  will need to take extra care to avoid injury. Because it may be weak, you may have difficulty moving it or using it. You may not know what position it is in without looking at it while the block is in effect.  3. For blocks in the legs and feet, returning to weight bearing and walking needs to be done carefully. You will need to wait until the numbness is entirely gone and the strength has returned. You should be able to move your leg and foot normally before you try and bear weight or walk. You will need someone to be with you when you first try to ensure you do not fall and possibly risk injury.  4. Bruising and tenderness at the needle site are common side effects and will resolve in a few days.  5. Persistent numbness or new problems with movement should be communicated to the surgeon or the  Surgery Center ([phone])/ [facility] ([phone]).    Hand Center Instructions Hand Surgery  Wound Care: Keep your hand elevated above the level of your heart.  Do not allow it to dangle by your side.  Keep the dressing dry and do not remove it unless your doctor advises you to do so.  He will usually change it at the time of your post-op visit.  Moving your fingers is advised to stimulate circulation but will depend on the site of your surgery.  If you have a splint applied, your doctor will advise you regarding movement.  Activity: Do not drive or operate machinery today.  Rest today and   then you may return to your normal activity and work as indicated by your physician.  Diet:  Drink liquids today or eat a light diet.  You may resume a regular diet tomorrow.    General expectations: Pain for two to three days. Fingers may become slightly swollen.  Call your doctor if any of the following occur: Severe pain not relieved by pain medication. Elevated temperature. Dressing soaked with blood. Inability to move fingers. White or bluish color to fingers.  

## 2020-05-06 NOTE — Anesthesia Postprocedure Evaluation (Signed)
Anesthesia Post Note  Patient: Aviva Wolfer  Procedure(s) Performed: OPEN REDUCTION INTERNAL FIXATION (ORIF) DISTAL RADIAL FRACTURE (Right Arm Lower) OPEN REDUCTION INTERNAL FIXATION (ORIF) ULNAR FRACTURE (Right Arm Lower)     Patient location during evaluation: PACU Anesthesia Type: General and Regional Level of consciousness: awake and alert Pain management: pain level controlled Vital Signs Assessment: post-procedure vital signs reviewed and stable Respiratory status: spontaneous breathing, nonlabored ventilation, respiratory function stable and patient connected to nasal cannula oxygen Cardiovascular status: blood pressure returned to baseline and stable Postop Assessment: no apparent nausea or vomiting Anesthetic complications: no   No complications documented.  Last Vitals:  Vitals:   05/05/20 1730 05/05/20 1749  BP: 117/72 125/71  Pulse: 79 77  Resp: 15 20  Temp:  37.4 C  SpO2: 96% 95%    Last Pain:  Vitals:   05/05/20 1749  TempSrc: Oral  PainSc: 0-No pain                 Rosario Kushner S
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
## 2020-05-08 ENCOUNTER — Encounter (HOSPITAL_BASED_OUTPATIENT_CLINIC_OR_DEPARTMENT_OTHER): Payer: Self-pay | Admitting: Orthopedic Surgery

## 2020-05-10 LAB — AEROBIC/ANAEROBIC CULTURE W GRAM STAIN (SURGICAL/DEEP WOUND): Culture: NO GROWTH
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
